# Patient Record
Sex: Female | Born: 1940 | Race: White | Hispanic: No | Marital: Married | Smoking: Never smoker
Health system: Southern US, Community
[De-identification: ages and names within clinical notes are randomized; demographics above are authoritative.]

## PROBLEM LIST (undated history)

## (undated) DIAGNOSIS — I10 Essential (primary) hypertension: Secondary | ICD-10-CM

---

## 2014-01-14 ENCOUNTER — Emergency Department (HOSPITAL_BASED_OUTPATIENT_CLINIC_OR_DEPARTMENT_OTHER)
Admission: EM | Admit: 2014-01-14 | Discharge: 2014-01-15 | Disposition: A | Discharge status: Home / Self Care | Payer: Medicare Other | Attending: Emergency Medicine | Admitting: Emergency Medicine

## 2014-01-14 ENCOUNTER — Encounter (HOSPITAL_BASED_OUTPATIENT_CLINIC_OR_DEPARTMENT_OTHER): Payer: Self-pay | Admitting: *Deleted

## 2014-01-14 DIAGNOSIS — L7622 Postprocedural hemorrhage and hematoma of skin and subcutaneous tissue following other procedure: Secondary | ICD-10-CM | POA: Insufficient documentation

## 2014-01-14 DIAGNOSIS — T148XXA Other injury of unspecified body region, initial encounter: Secondary | ICD-10-CM

## 2014-01-14 DIAGNOSIS — I10 Essential (primary) hypertension: Secondary | ICD-10-CM | POA: Insufficient documentation

## 2014-01-14 DIAGNOSIS — Z85828 Personal history of other malignant neoplasm of skin: Secondary | ICD-10-CM | POA: Insufficient documentation

## 2014-01-14 DIAGNOSIS — Z4801 Encounter for change or removal of surgical wound dressing: Secondary | ICD-10-CM | POA: Diagnosis present

## 2014-01-14 HISTORY — DX: Essential (primary) hypertension: I10

## 2014-01-14 MED ORDER — SILVER NITRATE-POT NITRATE 75-25 % EX MISC
CUTANEOUS | Status: AC
  Filled 2014-01-14: qty 1

## 2014-01-14 MED ORDER — GELATIN ABSORBABLE 12-7 MM EX MISC
CUTANEOUS | Status: AC
  Filled 2014-01-14: qty 1

## 2014-01-14 NOTE — ED Notes (Signed)
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 The dressing was falling off due to saturation with blood. She pulled the dressing off and has not been able to control the bleeding.

## 2014-01-14 NOTE — ED Provider Notes (Signed)
CSN: [id number]     Arrival date & time 01/14/14  2206 History  This chart was scribed for Richardean Canalavid H Yao, MD by Lionel DecemberHatice Demirci, ED Scribe. This patient was seen in room MH11/MH11 and the patient's care was started at 10:42 PM.    Chief Complaint  Patient presents with  . Wound Check    The history is provided by the patient. No language interpreter was used.    HPI Comments: Marilyn Decker is a 73 y.o. female who presents to the Emergency Department complaining of bleeding due to a excision for her basal cell carcinoma earlier today. It was cauterized twice but she is still bleeding. She denies using any blood thinners.      Past Medical History  Diagnosis Date  . Hypertension    History reviewed. No pertinent past surgical history. No family history on file. History  Substance Use Topics  . Smoking status: Never Smoker   . Smokeless tobacco: Not on file  . Alcohol Use: No   OB History    No data available     Review of Systems  Skin: Positive for wound.  Hematological: Does not bruise/bleed easily.  All other systems reviewed and are negative.     Allergies  Review of patient's allergies indicates no known allergies.  Home Medications   Prior to Admission medications   Medication Sig Start Date End Date Taking? Authorizing Provider  METOPROLOL TARTRATE PO Take by mouth.   Yes Historical Provider, MD   BP 166/68 mmHg  Pulse 72  Temp(Src) 98.3 F (36.8 C) (Oral)  Resp 18  Ht 5\' 2"  (1.575 m)  Wt 123 lb (55.792 kg)  BMI 22.49 kg/m2  SpO2 99% Physical Exam  Constitutional: She is oriented to person, place, and time. She appears well-developed and well-nourished.  HENT:  Head: Normocephalic and atraumatic.  Left side of the naso bridge 1 cm diameter wound with active oozing. No cartilage exposed. No nose bleed   Eyes: Conjunctivae are normal.  Neck: Normal range of motion. Neck supple.  Pulmonary/Chest: Effort normal.  Musculoskeletal: Normal range of motion.   Neurological: She is alert and oriented to person, place, and time.  Skin: Skin is warm and dry.  Psychiatric: She has a normal mood and affect.  Nursing note and vitals reviewed.   ED Course  Procedures (including critical care time) DIAGNOSTIC STUDIES: Oxygen Saturation is 99% on RA, normal by my interpretation.    COORDINATION OF CARE: 10:49 PM Discussed treatment plan with patient at beside, the patient agrees with the plan and has no further questions at this time.   Wound care I applied silver nitrate to the wound but the bleeding persisted. I attempted to stop the bleeding with surgicel and the bleeding improved but she still has oozing around the surgicel. I then applied wound seal and quick clot and bleeding stopped.    Labs Review Labs Reviewed - No data to display  Imaging Review No results found.   EKG Interpretation None      MDM   Final diagnoses:  None   Marilyn Decker is a 73 y.o. female here with bleeding from excision site. I tried silver nitrate with no improved. Then tried surgicel and wound seal and quick clot. The bleeding stopped. Will have her call dermatology in AM and get f/u in 1-2 days.    I personally performed the services described in this documentation, which was scribed in my presence. The recorded information has been reviewed and  is accurate.     Richardean Canalavid H Yao, MD 01/14/14 [phone]

## 2014-01-14 NOTE — Discharge Instructions (Signed)
Keep dressing in place.  Call dermatologist tomorrow to get follow up in 1-2 days.   Return to ER if you have more bleeding.

## 2014-01-15 ENCOUNTER — Emergency Department (HOSPITAL_BASED_OUTPATIENT_CLINIC_OR_DEPARTMENT_OTHER)
Admission: EM | Admit: 2014-01-15 | Discharge: 2014-01-15 | Disposition: A | Discharge status: Home / Self Care | Payer: Medicare Other | Source: Home / Self Care | Attending: Emergency Medicine | Admitting: Emergency Medicine

## 2014-01-15 ENCOUNTER — Encounter (HOSPITAL_BASED_OUTPATIENT_CLINIC_OR_DEPARTMENT_OTHER): Payer: Self-pay | Admitting: Emergency Medicine

## 2014-01-15 DIAGNOSIS — L7621 Postprocedural hemorrhage and hematoma of skin and subcutaneous tissue following a dermatologic procedure: Secondary | ICD-10-CM

## 2014-01-15 DIAGNOSIS — I10 Essential (primary) hypertension: Secondary | ICD-10-CM

## 2014-01-15 DIAGNOSIS — T148XXA Other injury of unspecified body region, initial encounter: Secondary | ICD-10-CM

## 2014-01-15 DIAGNOSIS — L7622 Postprocedural hemorrhage and hematoma of skin and subcutaneous tissue following other procedure: Secondary | ICD-10-CM | POA: Diagnosis not present

## 2014-01-15 MED ORDER — OXYMETAZOLINE HCL 0.05 % NA SOLN
1.0000 | Freq: Once | NASAL | Status: AC
  Administered 2014-01-15: 1 via NASAL

## 2014-01-15 MED ORDER — OXYMETAZOLINE HCL 0.05 % NA SOLN
NASAL | Status: AC
  Administered 2014-01-15: 1 via NASAL
  Filled 2014-01-15: qty 15

## 2014-01-15 NOTE — ED Notes (Signed)
Pt ambulated to waiting room after being seen earlier, bleeding started back in waiting room

## 2014-01-15 NOTE — ED Provider Notes (Signed)
CSN: [id number]     Arrival date & time 01/15/14  0010 History   First MD Initiated Contact with Patient 01/15/14 0026     Chief Complaint  Patient presents with  . Wound Check     (Consider location/radiation/quality/duration/timing/severity/associated sxs/prior Treatment) Patient is a 73 y.o. female presenting with wound check. The history is provided by the patient and the spouse.  Wound Check This is a new problem. The current episode started 12 to 24 hours ago. The problem occurs constantly. The problem has not changed since onset.Pertinent negatives include no chest pain, no abdominal pain, no headaches and no shortness of breath. Nothing aggravates the symptoms. Nothing relieves the symptoms. She has tried nothing for the symptoms. The treatment provided moderate relief.  [facility].  Bleeding started at dinner time.  Seen by Dr. Silverio LayYao and bleeding stopped initially and then returned and patient checked back in  Past Medical History  Diagnosis Date  . Hypertension    History reviewed. No pertinent past surgical history. History reviewed. No pertinent family history. History  Substance Use Topics  . Smoking status: Never Smoker   . Smokeless tobacco: Not on file  . Alcohol Use: No   OB History    No data available     Review of Systems  Constitutional: Negative for fever.  Respiratory: Negative for shortness of breath.   Cardiovascular: Negative for chest pain.  Gastrointestinal: Negative for abdominal pain.  Skin: Positive for wound.  Neurological: Negative for headaches.  All other systems reviewed and are negative.     Allergies  Review of patient's allergies indicates no known allergies.  Home Medications   Prior to Admission medications   Medication Sig Start Date End Date Taking? Authorizing Provider  METOPROLOL TARTRATE PO Take by mouth.    Historical Provider, MD   BP 157/73 mmHg  Pulse 75  Temp(Src)  97.8 F (36.6 C) (Oral)  Resp 18  Ht 5\' 2"  (1.575 m)  Wt 123 lb (55.792 kg)  BMI 22.49 kg/m2  SpO2 100% Physical Exam  Constitutional: She is oriented to person, place, and time. She appears well-developed and well-nourished. No distress.  HENT:  Head: Normocephalic.    Nose: No rhinorrhea or nasal septal hematoma. No epistaxis.    Mouth/Throat: Oropharynx is clear and moist.  Eyes: Conjunctivae and EOM are normal. Pupils are equal, round, and reactive to light.  Neck: Normal range of motion. Neck supple.  Cardiovascular: Normal rate and regular rhythm.   Pulmonary/Chest: Effort normal and breath sounds normal. She has no wheezes. She has no rales.  Abdominal: Soft. Bowel sounds are normal. There is no tenderness. There is no rebound and no guarding.  Musculoskeletal: Normal range of motion.  Neurological: She is alert and oriented to person, place, and time.  Skin: Skin is warm and dry.  Psychiatric: She has a normal mood and affect.    ED Course  Procedures (including critical care time) Labs Review Labs Reviewed - No data to display  Imaging Review No results found.   EKG Interpretation None      MDM   Final diagnoses:  None   Used combat gauze and wound sealer and padded area thoroughly.  Pressure applied clot formed no further oozing at this time.     Case d/w Dr. Marita KansasVernon [facility] to office first thing at 8 am  Do not remove dressing NPO.  Follow up in the office.  Jasmine AweApril K Thurma Priego-Rasch, MD 01/15/14 [phone]

## 2018-08-19 ENCOUNTER — Other Ambulatory Visit (HOSPITAL_COMMUNITY): Payer: Self-pay

## 2018-08-19 ENCOUNTER — Inpatient Hospital Stay
Admission: RE | Admit: 2018-08-19 | Discharge: 2018-09-02 | Disposition: A | Discharge status: Home / Self Care | Payer: Self-pay | Source: Ambulatory Visit | Attending: Internal Medicine | Admitting: Internal Medicine

## 2018-08-19 ENCOUNTER — Ambulatory Visit (HOSPITAL_COMMUNITY)
Admission: AD | Admit: 2018-08-19 | Discharge: 2018-08-19 | Disposition: A | Discharge status: Home / Self Care | Payer: Medicare Other | Source: Other Acute Inpatient Hospital | Attending: Internal Medicine | Admitting: Internal Medicine

## 2018-08-19 DIAGNOSIS — Z4659 Encounter for fitting and adjustment of other gastrointestinal appliance and device: Secondary | ICD-10-CM

## 2018-08-19 DIAGNOSIS — R0603 Acute respiratory distress: Secondary | ICD-10-CM

## 2018-08-19 DIAGNOSIS — K7460 Unspecified cirrhosis of liver: Secondary | ICD-10-CM

## 2018-08-19 DIAGNOSIS — J9690 Respiratory failure, unspecified, unspecified whether with hypoxia or hypercapnia: Secondary | ICD-10-CM

## 2018-08-19 DIAGNOSIS — R0902 Hypoxemia: Secondary | ICD-10-CM | POA: Insufficient documentation

## 2018-08-19 DIAGNOSIS — J189 Pneumonia, unspecified organism: Secondary | ICD-10-CM

## 2018-08-19 LAB — URINALYSIS, ROUTINE W REFLEX MICROSCOPIC
Bilirubin Urine: NEGATIVE
Glucose, UA: NEGATIVE mg/dL
Ketones, ur: 5 mg/dL — AB
Nitrite: NEGATIVE
Protein, ur: 100 mg/dL — AB
RBC / HPF: >50 RBC/hpf — ABNORMAL HIGH (ref 0–5)
Specific Gravity, Urine: 1.015 (ref 1.005–1.030)
pH: 5 (ref 5.0–8.0)

## 2018-08-19 MED ORDER — DEXTROSE 10 % IV SOLN
125.00 | INTRAVENOUS | Status: DC
Start: ? — End: 2018-08-19

## 2018-08-19 MED ORDER — FAMOTIDINE 20 MG/2ML IV SOLN
20.00 | INTRAVENOUS | Status: DC
Start: 2018-08-19 — End: 2018-08-19

## 2018-08-19 MED ORDER — ACETAMINOPHEN 325 MG PO TABS
650.00 | ORAL_TABLET | ORAL | Status: DC
Start: ? — End: 2018-08-19

## 2018-08-19 MED ORDER — INSULIN LISPRO 100 UNIT/ML ~~LOC~~ SOLN
.00 | SUBCUTANEOUS | Status: DC
Start: 2018-08-19 — End: 2018-08-19

## 2018-08-19 MED ORDER — GLUCAGON HCL RDNA (DIAGNOSTIC) 1 MG IJ SOLR
1.00 | INTRAMUSCULAR | Status: DC
Start: ? — End: 2018-08-19

## 2018-08-19 MED ORDER — PROPRANOLOL HCL 10 MG PO TABS
10.00 | ORAL_TABLET | ORAL | Status: DC
Start: 2018-08-19 — End: 2018-08-19

## 2018-08-19 MED ORDER — FOLIC ACID 1 MG PO TABS
1.00 | ORAL_TABLET | ORAL | Status: DC
Start: 2018-08-20 — End: 2018-08-19

## 2018-08-19 MED ORDER — GENERIC EXTERNAL MEDICATION
5.00 | Status: DC
Start: ? — End: 2018-08-19

## 2018-08-19 MED ORDER — GENERIC EXTERNAL MEDICATION
2.00 | Status: DC
Start: 2018-08-20 — End: 2018-08-19

## 2018-08-19 MED ORDER — FUROSEMIDE 10 MG/ML IJ SOLN
20.00 | INTRAMUSCULAR | Status: DC
Start: 2018-08-19 — End: 2018-08-19

## 2018-08-19 MED ORDER — GLUCOSE 40 % PO GEL
15.00 | ORAL | Status: DC
Start: ? — End: 2018-08-19

## 2018-08-19 MED ORDER — SODIUM CHLORIDE 0.45 % IV SOLN
INTRAVENOUS | Status: DC
Start: ? — End: 2018-08-19

## 2018-08-19 MED ORDER — GENERIC EXTERNAL MEDICATION
1.00 | Status: DC
Start: 2018-08-20 — End: 2018-08-19

## 2018-08-20 LAB — COMPREHENSIVE METABOLIC PANEL
ALT: 47 U/L — ABNORMAL HIGH (ref 0–44)
AST: 61 U/L — ABNORMAL HIGH (ref 15–41)
Albumin: 2.8 g/dL — ABNORMAL LOW (ref 3.5–5.0)
Alkaline Phosphatase: 230 U/L — ABNORMAL HIGH (ref 38–126)
Anion gap: 9 (ref 5–15)
BUN: 28 mg/dL — ABNORMAL HIGH (ref 8–23)
CO2: 28 mmol/L (ref 22–32)
Calcium: 8.8 mg/dL — ABNORMAL LOW (ref 8.9–10.3)
Chloride: 105 mmol/L (ref 98–111)
Creatinine, Ser: 1.23 mg/dL — ABNORMAL HIGH (ref 0.44–1.00)
GFR calc Af Amer: 49 mL/min — ABNORMAL LOW (ref >60)
GFR calc non Af Amer: 42 mL/min — ABNORMAL LOW (ref >60)
Glucose, Bld: 125 mg/dL — ABNORMAL HIGH (ref 70–99)
Potassium: 3.1 mmol/L — ABNORMAL LOW (ref 3.5–5.1)
Sodium: 142 mmol/L (ref 135–145)
Total Bilirubin: 1.7 mg/dL — ABNORMAL HIGH (ref 0.3–1.2)
Total Protein: 6.1 g/dL — ABNORMAL LOW (ref 6.5–8.1)

## 2018-08-20 LAB — CBC WITH DIFFERENTIAL/PLATELET
Abs Immature Granulocytes: 0.02 10*3/uL (ref 0.00–0.07)
Basophils Absolute: 0 10*3/uL (ref 0.0–0.1)
Basophils Relative: 1 %
Eosinophils Absolute: 0 10*3/uL (ref 0.0–0.5)
Eosinophils Relative: 1 %
HCT: 27.4 % — ABNORMAL LOW (ref 36.0–46.0)
Hemoglobin: 9.4 g/dL — ABNORMAL LOW (ref 12.0–15.0)
Immature Granulocytes: 1 %
Lymphocytes Relative: 20 %
Lymphs Abs: 0.5 10*3/uL — ABNORMAL LOW (ref 0.7–4.0)
MCH: 37 pg — ABNORMAL HIGH (ref 26.0–34.0)
MCHC: 34.3 g/dL (ref 30.0–36.0)
MCV: 107.9 fL — ABNORMAL HIGH (ref 80.0–100.0)
Monocytes Absolute: 0.2 10*3/uL (ref 0.1–1.0)
Monocytes Relative: 10 %
Neutro Abs: 1.6 10*3/uL — ABNORMAL LOW (ref 1.7–7.7)
Neutrophils Relative %: 67 %
Platelets: 85 10*3/uL — ABNORMAL LOW (ref 150–400)
RBC: 2.54 MIL/uL — ABNORMAL LOW (ref 3.87–5.11)
RDW: 15.2 % (ref 11.5–15.5)
WBC: 2.3 10*3/uL — ABNORMAL LOW (ref 4.0–10.5)
nRBC: 0 % (ref 0.0–0.2)

## 2018-08-20 LAB — URINE CULTURE: Culture: NO GROWTH

## 2018-08-20 LAB — HEMOGLOBIN A1C
Hgb A1c MFr Bld: 5 % (ref 4.8–5.6)
Mean Plasma Glucose: 96.8 mg/dL

## 2018-08-20 LAB — PROTIME-INR
INR: 1.5 — ABNORMAL HIGH (ref 0.8–1.2)
Prothrombin Time: 17.9 seconds — ABNORMAL HIGH (ref 11.4–15.2)

## 2018-08-20 LAB — PHOSPHORUS: Phosphorus: 3 mg/dL (ref 2.5–4.6)

## 2018-08-20 LAB — TSH: TSH: 2.804 u[IU]/mL (ref 0.350–4.500)

## 2018-08-20 LAB — MAGNESIUM: Magnesium: 2.1 mg/dL (ref 1.7–2.4)

## 2018-08-21 LAB — CBC
HCT: 30.2 % — ABNORMAL LOW (ref 36.0–46.0)
Hemoglobin: 10.2 g/dL — ABNORMAL LOW (ref 12.0–15.0)
MCH: 36.7 pg — ABNORMAL HIGH (ref 26.0–34.0)
MCHC: 33.8 g/dL (ref 30.0–36.0)
MCV: 108.6 fL — ABNORMAL HIGH (ref 80.0–100.0)
Platelets: 124 10*3/uL — ABNORMAL LOW (ref 150–400)
RBC: 2.78 MIL/uL — ABNORMAL LOW (ref 3.87–5.11)
RDW: 15.8 % — ABNORMAL HIGH (ref 11.5–15.5)
WBC: 4.7 10*3/uL (ref 4.0–10.5)
nRBC: 0 % (ref 0.0–0.2)

## 2018-08-21 LAB — BLOOD GAS, ARTERIAL
Acid-Base Excess: 2.8 mmol/L — ABNORMAL HIGH (ref 0.0–2.0)
Bicarbonate: 27 mmol/L (ref 20.0–28.0)
Delivery systems: POSITIVE
Expiratory PAP: 5
FIO2: 60
Inspiratory PAP: 13
O2 Saturation: 97.7 %
Patient temperature: 96.5
RATE: 35 resp/min
pCO2 arterial: 41.2 mmHg (ref 32.0–48.0)
pH, Arterial: 7.426 (ref 7.350–7.450)
pO2, Arterial: 99.7 mmHg (ref 83.0–108.0)

## 2018-08-21 LAB — RENAL FUNCTION PANEL
Albumin: 2.7 g/dL — ABNORMAL LOW (ref 3.5–5.0)
Anion gap: 13 (ref 5–15)
BUN: 29 mg/dL — ABNORMAL HIGH (ref 8–23)
CO2: 25 mmol/L (ref 22–32)
Calcium: 8.9 mg/dL (ref 8.9–10.3)
Chloride: 109 mmol/L (ref 98–111)
Creatinine, Ser: 1.08 mg/dL — ABNORMAL HIGH (ref 0.44–1.00)
GFR calc Af Amer: 57 mL/min — ABNORMAL LOW (ref >60)
GFR calc non Af Amer: 49 mL/min — ABNORMAL LOW (ref >60)
Glucose, Bld: 119 mg/dL — ABNORMAL HIGH (ref 70–99)
Phosphorus: 3.3 mg/dL (ref 2.5–4.6)
Potassium: 3.7 mmol/L (ref 3.5–5.1)
Sodium: 147 mmol/L — ABNORMAL HIGH (ref 135–145)

## 2018-08-21 LAB — MAGNESIUM: Magnesium: 1.8 mg/dL (ref 1.7–2.4)

## 2018-08-22 ENCOUNTER — Other Ambulatory Visit (HOSPITAL_COMMUNITY): Payer: Self-pay

## 2018-08-22 LAB — BLOOD GAS, ARTERIAL
Acid-Base Excess: 4.9 mmol/L — ABNORMAL HIGH (ref 0.0–2.0)
Bicarbonate: 27.8 mmol/L (ref 20.0–28.0)
O2 Content: 2 L/min
O2 Saturation: 89.5 %
Patient temperature: 98.6
pCO2 arterial: 33.8 mmHg (ref 32.0–48.0)
pH, Arterial: 7.526 — ABNORMAL HIGH (ref 7.350–7.450)
pO2, Arterial: 54.8 mmHg — ABNORMAL LOW (ref 83.0–108.0)

## 2018-08-22 LAB — BASIC METABOLIC PANEL
Anion gap: 18 — ABNORMAL HIGH (ref 5–15)
BUN: 31 mg/dL — ABNORMAL HIGH (ref 8–23)
CO2: 23 mmol/L (ref 22–32)
Calcium: 9.1 mg/dL (ref 8.9–10.3)
Chloride: 106 mmol/L (ref 98–111)
Creatinine, Ser: 1.25 mg/dL — ABNORMAL HIGH (ref 0.44–1.00)
GFR calc Af Amer: 48 mL/min — ABNORMAL LOW (ref >60)
GFR calc non Af Amer: 41 mL/min — ABNORMAL LOW (ref >60)
Glucose, Bld: 95 mg/dL (ref 70–99)
Potassium: 3.5 mmol/L (ref 3.5–5.1)
Sodium: 147 mmol/L — ABNORMAL HIGH (ref 135–145)

## 2018-08-22 LAB — MAGNESIUM: Magnesium: 1.9 mg/dL (ref 1.7–2.4)

## 2018-08-22 LAB — PHOSPHORUS: Phosphorus: 3.7 mg/dL (ref 2.5–4.6)

## 2018-08-23 ENCOUNTER — Other Ambulatory Visit (HOSPITAL_COMMUNITY): Payer: Self-pay

## 2018-08-23 LAB — RENAL FUNCTION PANEL
Albumin: 2.6 g/dL — ABNORMAL LOW (ref 3.5–5.0)
Anion gap: 9 (ref 5–15)
BUN: 34 mg/dL — ABNORMAL HIGH (ref 8–23)
CO2: 27 mmol/L (ref 22–32)
Calcium: 8.4 mg/dL — ABNORMAL LOW (ref 8.9–10.3)
Chloride: 104 mmol/L (ref 98–111)
Creatinine, Ser: 1.51 mg/dL — ABNORMAL HIGH (ref 0.44–1.00)
GFR calc Af Amer: 38 mL/min — ABNORMAL LOW (ref >60)
GFR calc non Af Amer: 33 mL/min — ABNORMAL LOW (ref >60)
Glucose, Bld: 101 mg/dL — ABNORMAL HIGH (ref 70–99)
Phosphorus: 4.2 mg/dL (ref 2.5–4.6)
Potassium: 3.1 mmol/L — ABNORMAL LOW (ref 3.5–5.1)
Sodium: 140 mmol/L (ref 135–145)

## 2018-08-23 LAB — CBC
HCT: 25 % — ABNORMAL LOW (ref 36.0–46.0)
Hemoglobin: 8.3 g/dL — ABNORMAL LOW (ref 12.0–15.0)
MCH: 36.9 pg — ABNORMAL HIGH (ref 26.0–34.0)
MCHC: 33.2 g/dL (ref 30.0–36.0)
MCV: 111.1 fL — ABNORMAL HIGH (ref 80.0–100.0)
Platelets: 106 10*3/uL — ABNORMAL LOW (ref 150–400)
RBC: 2.25 MIL/uL — ABNORMAL LOW (ref 3.87–5.11)
RDW: 16.1 % — ABNORMAL HIGH (ref 11.5–15.5)
WBC: 3.9 10*3/uL — ABNORMAL LOW (ref 4.0–10.5)
nRBC: 0.5 % — ABNORMAL HIGH (ref 0.0–0.2)

## 2018-08-23 LAB — AMMONIA: Ammonia: 23 umol/L (ref 9–35)

## 2018-08-23 LAB — BLOOD GAS, ARTERIAL
Acid-Base Excess: 3 mmol/L — ABNORMAL HIGH (ref 0.0–2.0)
Bicarbonate: 27.3 mmol/L (ref 20.0–28.0)
O2 Content: 5 L/min
O2 Saturation: 97.3 %
Patient temperature: 98.6
pCO2 arterial: 43.7 mmHg (ref 32.0–48.0)
pH, Arterial: 7.412 (ref 7.350–7.450)
pO2, Arterial: 96.3 mmHg (ref 83.0–108.0)

## 2018-08-23 LAB — MAGNESIUM: Magnesium: 1.8 mg/dL (ref 1.7–2.4)

## 2018-08-24 LAB — MAGNESIUM: Magnesium: 1.9 mg/dL (ref 1.7–2.4)

## 2018-08-24 LAB — RENAL FUNCTION PANEL
Albumin: 2.6 g/dL — ABNORMAL LOW (ref 3.5–5.0)
Anion gap: 12 (ref 5–15)
BUN: 43 mg/dL — ABNORMAL HIGH (ref 8–23)
CO2: 23 mmol/L (ref 22–32)
Calcium: 8.3 mg/dL — ABNORMAL LOW (ref 8.9–10.3)
Chloride: 105 mmol/L (ref 98–111)
Creatinine, Ser: 1.99 mg/dL — ABNORMAL HIGH (ref 0.44–1.00)
GFR calc Af Amer: 27 mL/min — ABNORMAL LOW (ref >60)
GFR calc non Af Amer: 24 mL/min — ABNORMAL LOW (ref >60)
Glucose, Bld: 111 mg/dL — ABNORMAL HIGH (ref 70–99)
Phosphorus: 3.4 mg/dL (ref 2.5–4.6)
Potassium: 3.6 mmol/L (ref 3.5–5.1)
Sodium: 140 mmol/L (ref 135–145)

## 2018-08-24 LAB — CBC
HCT: 25.7 % — ABNORMAL LOW (ref 36.0–46.0)
Hemoglobin: 8.4 g/dL — ABNORMAL LOW (ref 12.0–15.0)
MCH: 37.3 pg — ABNORMAL HIGH (ref 26.0–34.0)
MCHC: 32.7 g/dL (ref 30.0–36.0)
MCV: 114.2 fL — ABNORMAL HIGH (ref 80.0–100.0)
Platelets: 110 10*3/uL — ABNORMAL LOW (ref 150–400)
RBC: 2.25 MIL/uL — ABNORMAL LOW (ref 3.87–5.11)
RDW: 16.3 % — ABNORMAL HIGH (ref 11.5–15.5)
WBC: 2.9 10*3/uL — ABNORMAL LOW (ref 4.0–10.5)
nRBC: 0 % (ref 0.0–0.2)

## 2018-08-26 LAB — CBC
HCT: 31 % — ABNORMAL LOW (ref 36.0–46.0)
Hemoglobin: 10.4 g/dL — ABNORMAL LOW (ref 12.0–15.0)
MCH: 37 pg — ABNORMAL HIGH (ref 26.0–34.0)
MCHC: 33.5 g/dL (ref 30.0–36.0)
MCV: 110.3 fL — ABNORMAL HIGH (ref 80.0–100.0)
Platelets: 101 10*3/uL — ABNORMAL LOW (ref 150–400)
RBC: 2.81 MIL/uL — ABNORMAL LOW (ref 3.87–5.11)
RDW: 16.5 % — ABNORMAL HIGH (ref 11.5–15.5)
WBC: 2.2 10*3/uL — ABNORMAL LOW (ref 4.0–10.5)
nRBC: 0 % (ref 0.0–0.2)

## 2018-08-26 LAB — RENAL FUNCTION PANEL
Albumin: 2.7 g/dL — ABNORMAL LOW (ref 3.5–5.0)
Anion gap: 8 (ref 5–15)
BUN: 25 mg/dL — ABNORMAL HIGH (ref 8–23)
CO2: 27 mmol/L (ref 22–32)
Calcium: 8.8 mg/dL — ABNORMAL LOW (ref 8.9–10.3)
Chloride: 107 mmol/L (ref 98–111)
Creatinine, Ser: 1.28 mg/dL — ABNORMAL HIGH (ref 0.44–1.00)
GFR calc Af Amer: 47 mL/min — ABNORMAL LOW (ref >60)
GFR calc non Af Amer: 40 mL/min — ABNORMAL LOW (ref >60)
Glucose, Bld: 113 mg/dL — ABNORMAL HIGH (ref 70–99)
Phosphorus: 2.7 mg/dL (ref 2.5–4.6)
Potassium: 3.8 mmol/L (ref 3.5–5.1)
Sodium: 142 mmol/L (ref 135–145)

## 2018-08-29 LAB — CBC
HCT: 29.6 % — ABNORMAL LOW (ref 36.0–46.0)
Hemoglobin: 9.7 g/dL — ABNORMAL LOW (ref 12.0–15.0)
MCH: 36.3 pg — ABNORMAL HIGH (ref 26.0–34.0)
MCHC: 32.8 g/dL (ref 30.0–36.0)
MCV: 110.9 fL — ABNORMAL HIGH (ref 80.0–100.0)
Platelets: 103 10*3/uL — ABNORMAL LOW (ref 150–400)
RBC: 2.67 MIL/uL — ABNORMAL LOW (ref 3.87–5.11)
RDW: 16 % — ABNORMAL HIGH (ref 11.5–15.5)
WBC: 1.8 10*3/uL — ABNORMAL LOW (ref 4.0–10.5)
nRBC: 0 % (ref 0.0–0.2)

## 2018-08-29 LAB — COMPREHENSIVE METABOLIC PANEL
ALT: 33 U/L (ref 0–44)
AST: 50 U/L — ABNORMAL HIGH (ref 15–41)
Albumin: 2.5 g/dL — ABNORMAL LOW (ref 3.5–5.0)
Alkaline Phosphatase: 261 U/L — ABNORMAL HIGH (ref 38–126)
Anion gap: 8 (ref 5–15)
BUN: 18 mg/dL (ref 8–23)
CO2: 25 mmol/L (ref 22–32)
Calcium: 8.4 mg/dL — ABNORMAL LOW (ref 8.9–10.3)
Chloride: 110 mmol/L (ref 98–111)
Creatinine, Ser: 1.2 mg/dL — ABNORMAL HIGH (ref 0.44–1.00)
GFR calc Af Amer: 50 mL/min — ABNORMAL LOW (ref >60)
GFR calc non Af Amer: 44 mL/min — ABNORMAL LOW (ref >60)
Glucose, Bld: 108 mg/dL — ABNORMAL HIGH (ref 70–99)
Potassium: 4.1 mmol/L (ref 3.5–5.1)
Sodium: 143 mmol/L (ref 135–145)
Total Bilirubin: 1.2 mg/dL (ref 0.3–1.2)
Total Protein: 5.5 g/dL — ABNORMAL LOW (ref 6.5–8.1)

## 2018-08-29 LAB — MAGNESIUM: Magnesium: 1.4 mg/dL — ABNORMAL LOW (ref 1.7–2.4)

## 2018-08-30 LAB — MAGNESIUM: Magnesium: 2.1 mg/dL (ref 1.7–2.4)

## 2018-08-31 LAB — BASIC METABOLIC PANEL
Anion gap: 5 (ref 5–15)
BUN: 18 mg/dL (ref 8–23)
CO2: 26 mmol/L (ref 22–32)
Calcium: 8.5 mg/dL — ABNORMAL LOW (ref 8.9–10.3)
Chloride: 111 mmol/L (ref 98–111)
Creatinine, Ser: 1.16 mg/dL — ABNORMAL HIGH (ref 0.44–1.00)
GFR calc Af Amer: 53 mL/min — ABNORMAL LOW (ref >60)
GFR calc non Af Amer: 45 mL/min — ABNORMAL LOW (ref >60)
Glucose, Bld: 119 mg/dL — ABNORMAL HIGH (ref 70–99)
Potassium: 4.2 mmol/L (ref 3.5–5.1)
Sodium: 142 mmol/L (ref 135–145)

## 2019-12-13 IMAGING — DX PORTABLE CHEST - 1 VIEW
1 series · 1 of 1 positions shown · non-contrast
Comparison: 08/19/2018

CLINICAL DATA: Respiratory distress

EXAM:
PORTABLE CHEST 1 VIEW

[chest]
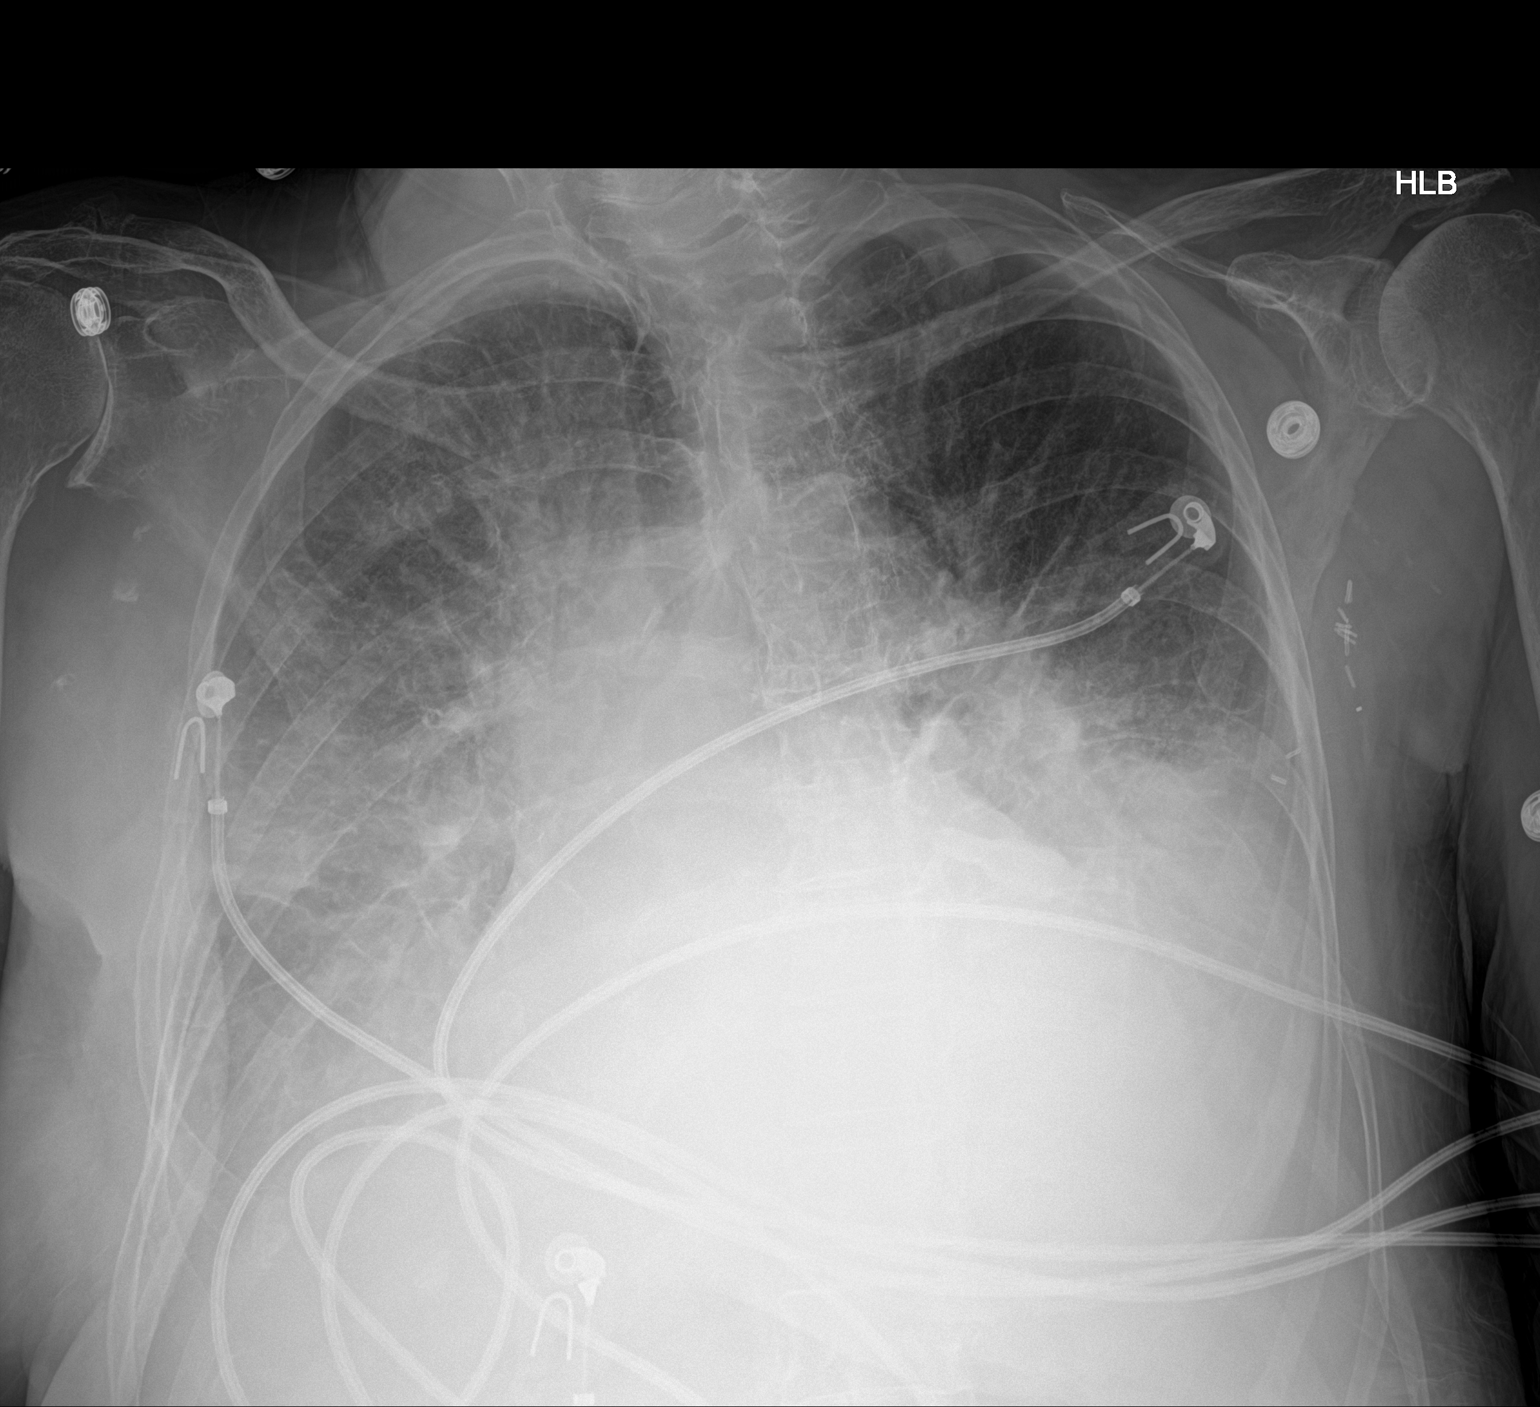

[1 of 1 positions shown; findings below may reference images not displayed]

FINDINGS: Cardiomegaly. Diffuse bilateral interstitial and airspace opacities,
right greater than left. Layering effusions. Findings concerning for
edema/CHF. No real change since prior study.
IMPRESSION: Findings of moderate CHF. Layering effusions with bibasilar
opacities. No real change.

## 2019-12-13 IMAGING — US ULTRASOUND ABDOMEN COMPLETE
2 series · 13 of 25 positions shown · non-contrast
Comparison: CT scan of July 04, 2018.

CLINICAL DATA: Hepatic cirrhosis.

EXAM:
ABDOMEN ULTRASOUND COMPLETE

[Series 1: ultrasound abdomen complete · 12 of 96 slices shown (1 of 2)]
[im 1/96]
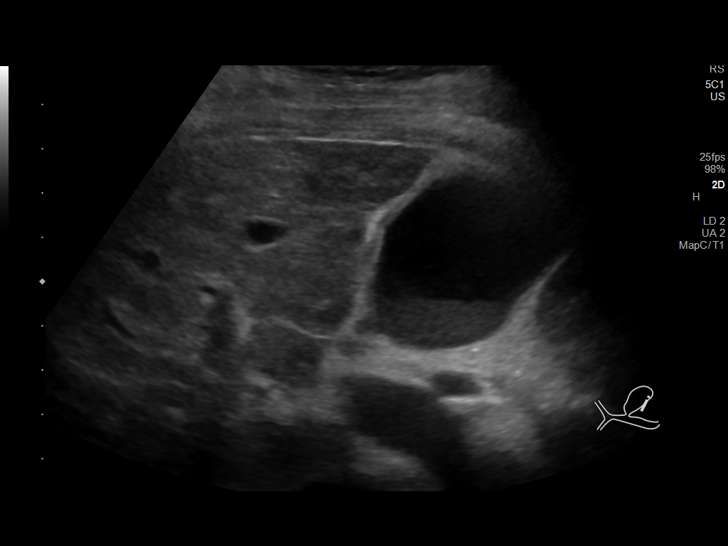
[im 9/96]
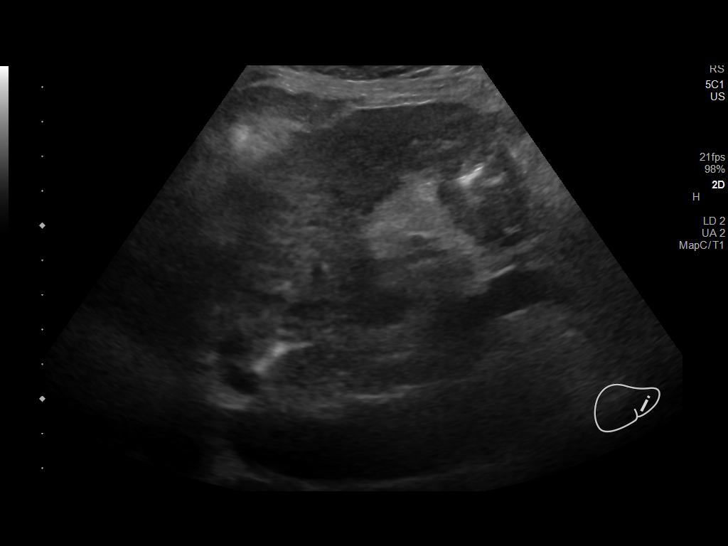
[im 17/96]
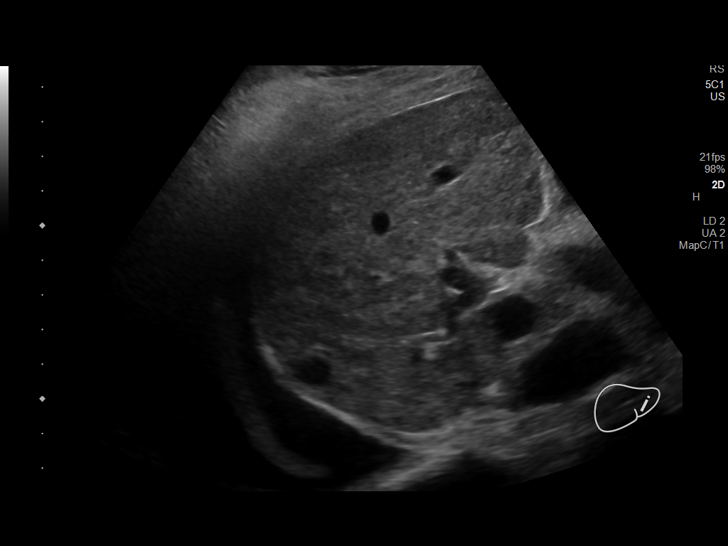
[im 25/96]
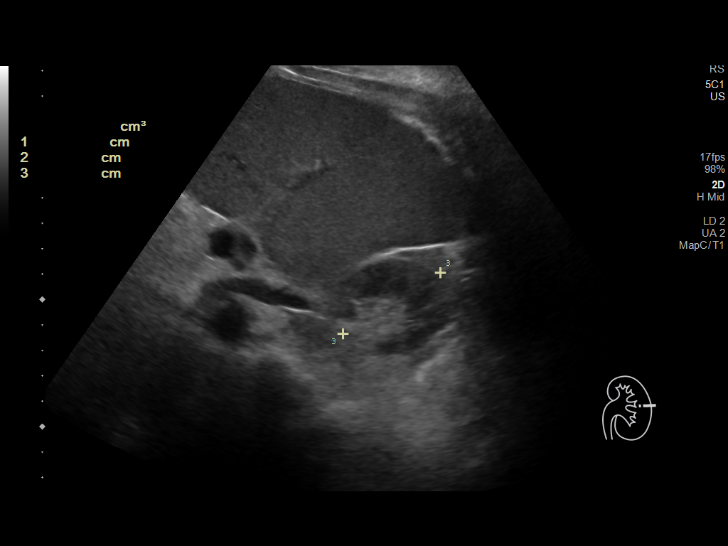
[im 34/96]
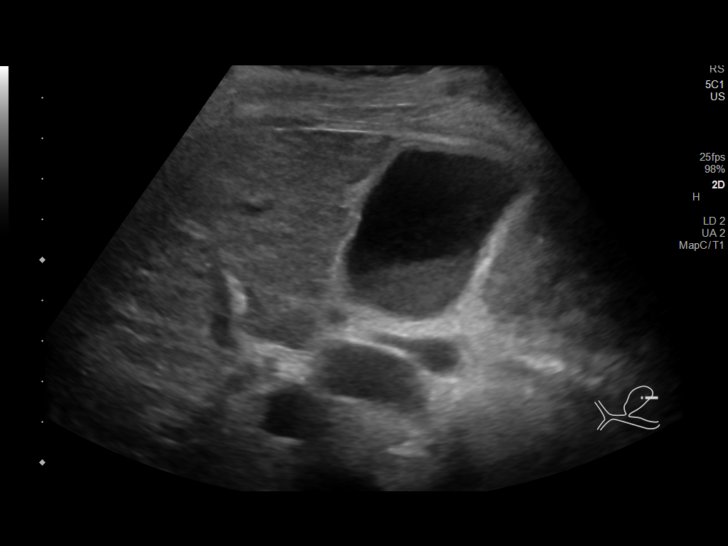
[im 42/96]
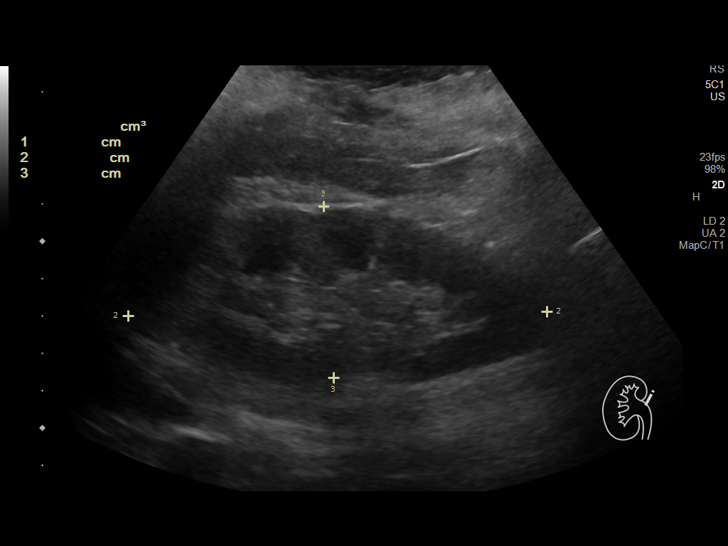
[im 50/96]
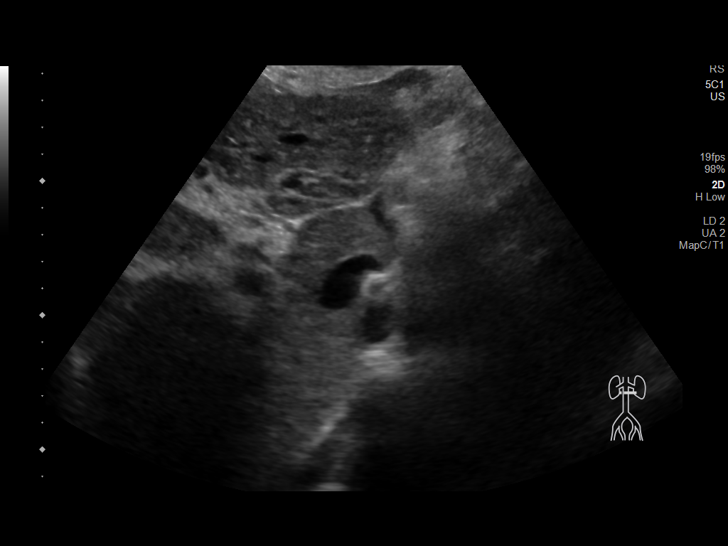
[im 58/96]
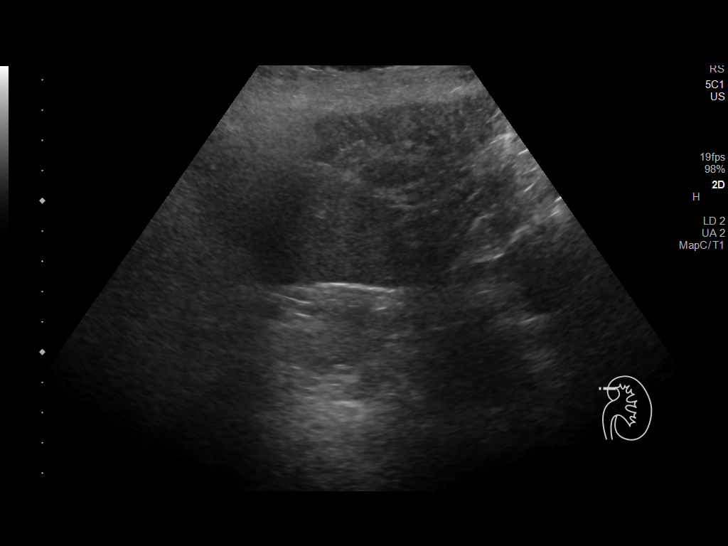
[im 67/96]
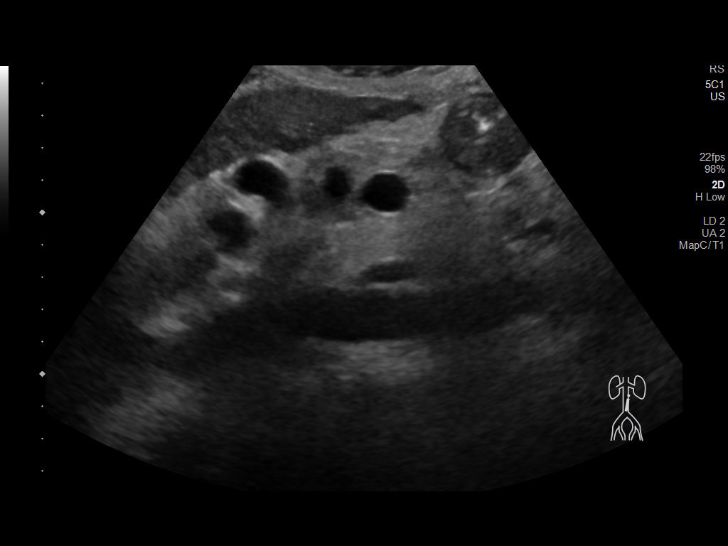
[im 75/96]
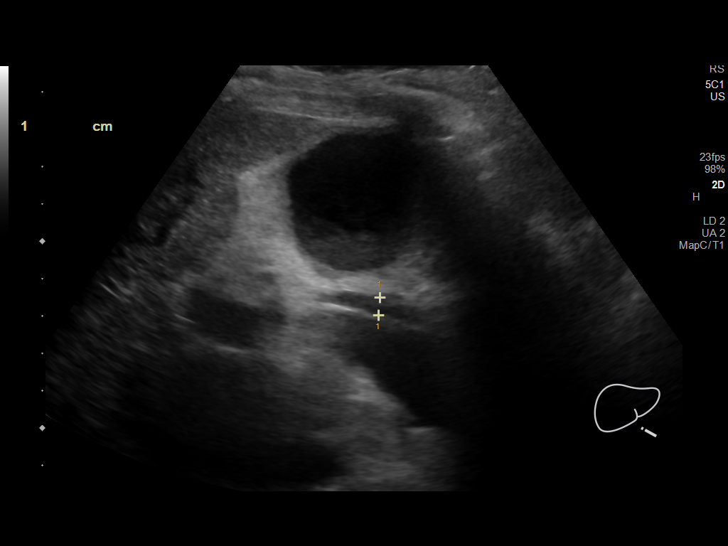
[im 83/96]
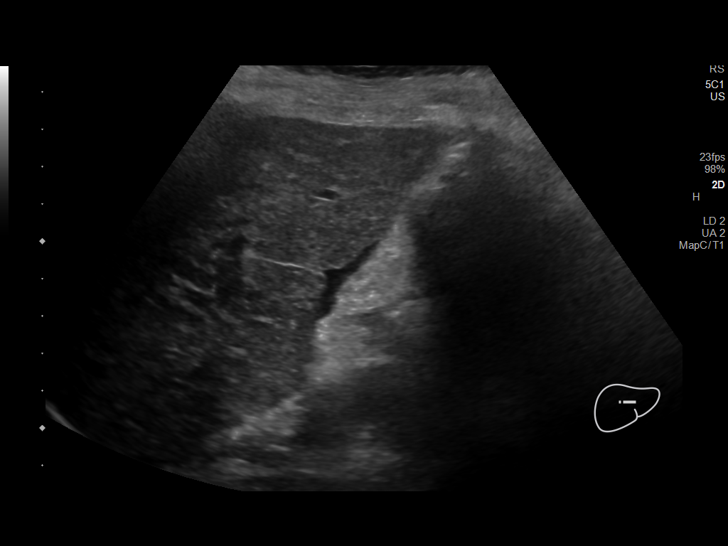
[im 91/96]
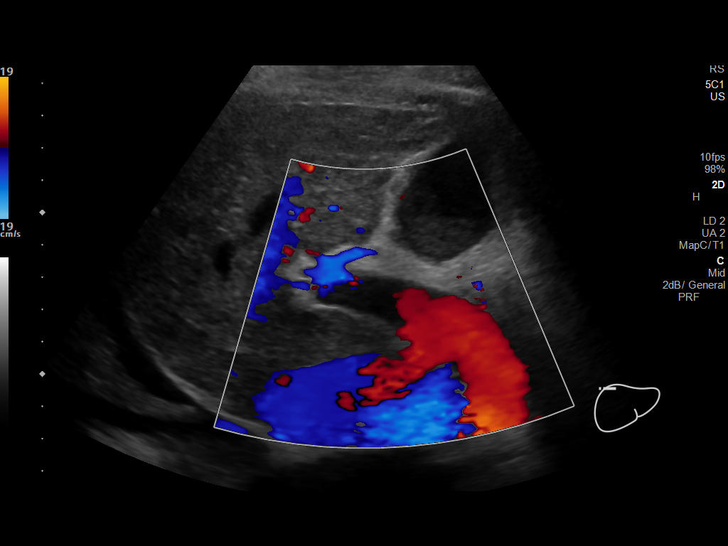

[Series 2: ultrasound abdomen complete · 1 of 3 slices shown (2 of 2)]
[im 1/3]
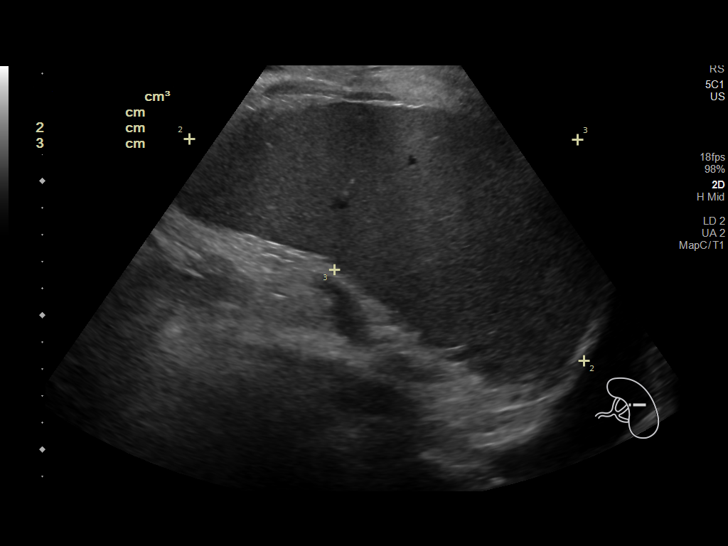

[13 of 25 positions shown; findings below may reference images not displayed]

FINDINGS: Gallbladder: Sludge is present within gallbladder lumen. No
cholelithiasis is noted. Moderate gallbladder wall thickening is
noted at 5 mm. Patient was unresponsive, and therefore the absence
or presence of sonographic Murphy's sign could not be assessed. No
pericholecystic fluid is noted.

Common bile duct: Diameter: 5 mm which is within normal limits.

Liver: Heterogeneous echotexture of hepatic parenchyma is noted with
nodular hepatic contours consistent with hepatic cirrhosis. 1.2 cm
simple cyst is noted posteriorly in the right hepatic lobe. Portal
vein is patent on color Doppler imaging with normal direction of
blood flow towards the liver.

IVC: No abnormality visualized.

Pancreas: Not well visualized due to overlying bowel gas.

Spleen: Measures 17.8 x 16.8 x 10.3 cm with calculated volume of
4946 cubic cm consistent with moderate splenomegaly. No focal
abnormality is noted.

Right Kidney: Length: 11.2 cm. Echogenicity within normal limits. No
mass or hydronephrosis visualized.

Left Kidney: Length: 10.7 cm. Echogenicity within normal limits. No
mass or hydronephrosis visualized.

Abdominal aorta: No aneurysm visualized.

Other findings: None.
IMPRESSION: Hepatic cirrhosis is again noted with moderate splenomegaly. Stable
right hepatic cyst is noted.

Sludge is noted within gallbladder lumen without cholelithiasis.
Moderate gallbladder wall thickening is noted most likely due to
adjacent hepatocellular disease.

Pancreas not visualized due to overlying bowel gas.

## 2019-12-30 DEATH — deceased
